# Patient Record
Sex: Male | Born: 2000 | Race: White | Hispanic: No | Marital: Single | Smoking: Never smoker
Health system: Southern US, Community
[De-identification: ages and names within clinical notes are randomized; demographics above are authoritative.]

---
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
## 2018-07-04 ENCOUNTER — Other Ambulatory Visit: Payer: Self-pay | Admitting: Family Medicine

## 2018-12-09 DIAGNOSIS — L600 Ingrowing nail: Secondary | ICD-10-CM | POA: Diagnosis not present

## 2018-12-09 DIAGNOSIS — M25775 Osteophyte, left foot: Secondary | ICD-10-CM | POA: Diagnosis not present

## 2019-01-17 IMAGING — DX DG SHOULDER 2+V*R*
3 series · 3 of 3 positions shown · non-contrast
Comparison: None.

CLINICAL DATA: Pain for 2 weeks

EXAM:
RIGHT SHOULDER - 2+ VIEW

[shoulder grashey]
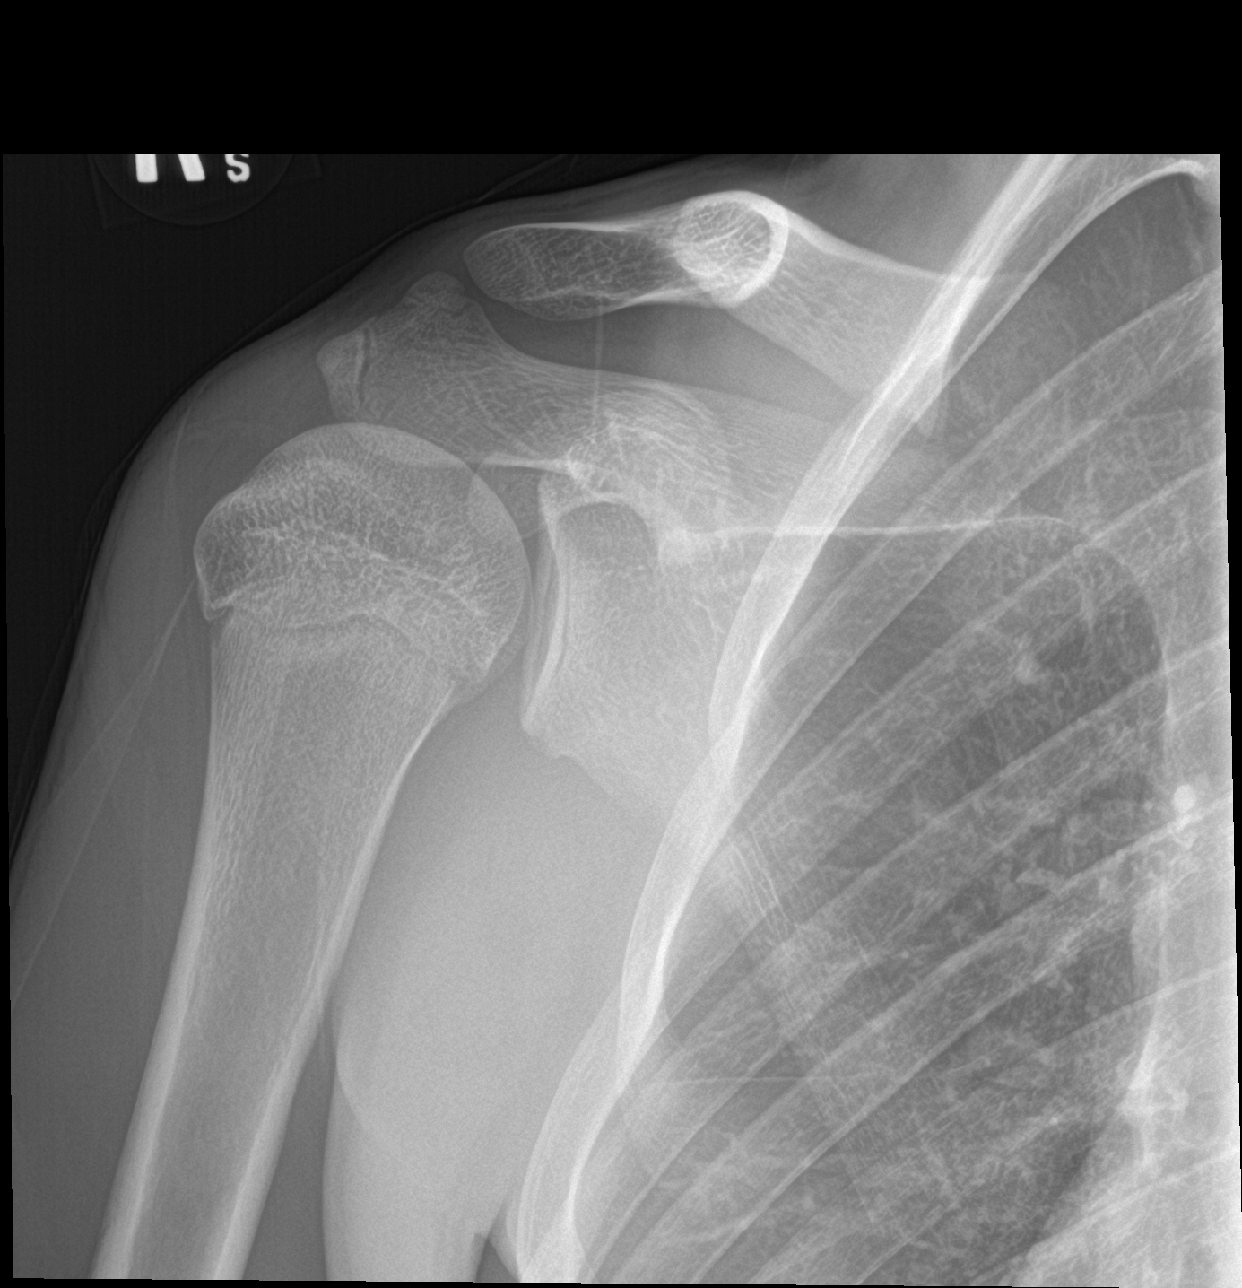

[shoulder y view]
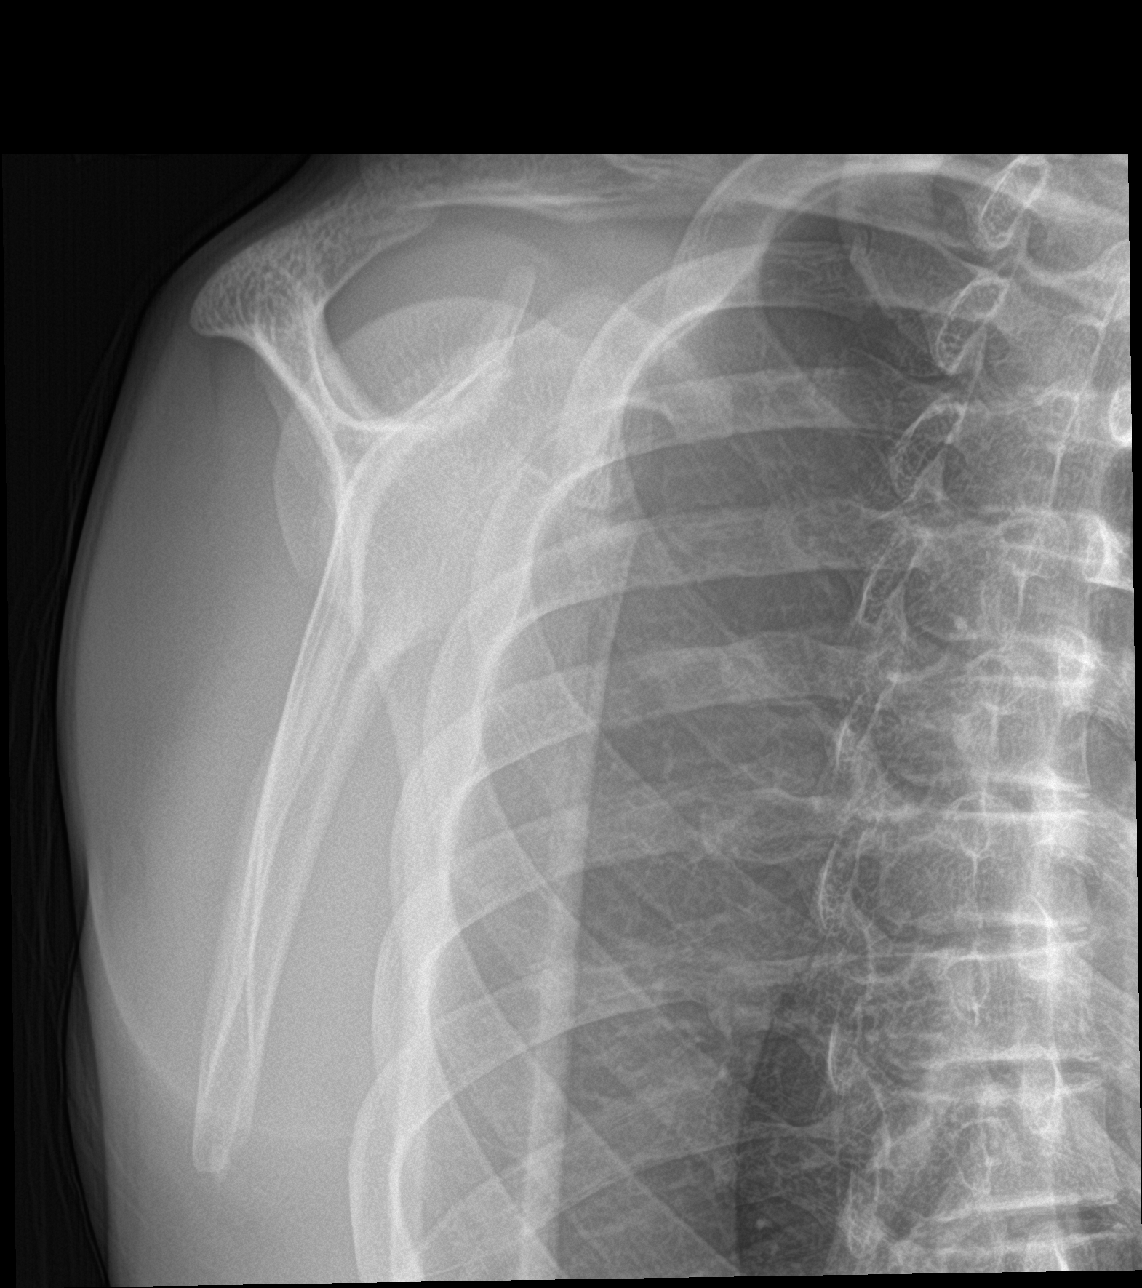

[shoulder axillary]
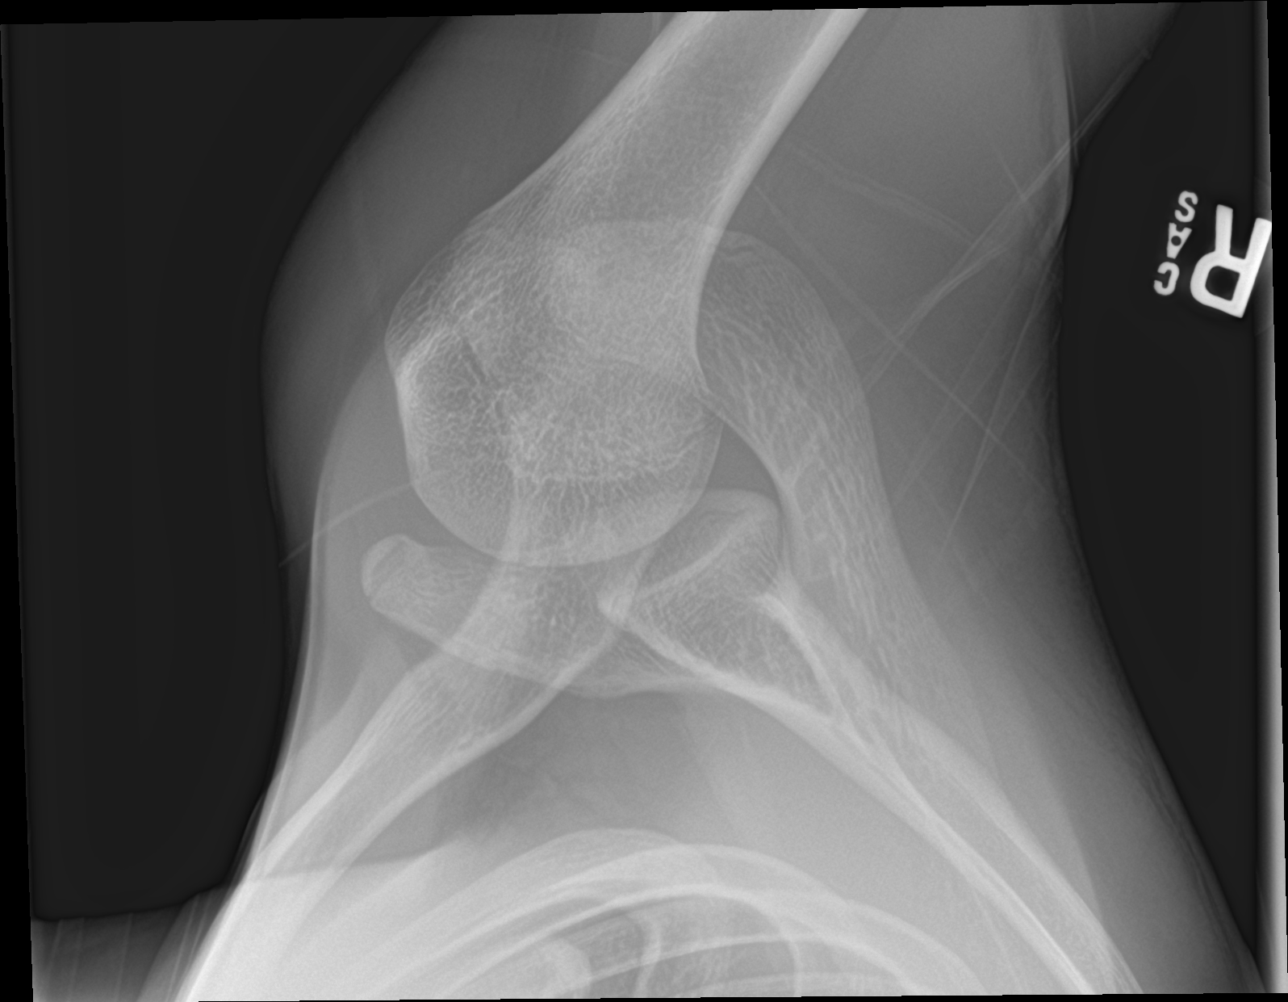

[3 of 3 positions shown; findings below may reference images not displayed]

FINDINGS: Oblique, Y scapular, and axillary images were obtained. No fracture
or dislocation. Joint spaces appear normal. No erosive change.
Visualized right lung is clear.
IMPRESSION: No fracture or dislocation.  No evident arthropathic change.

## 2019-02-02 DIAGNOSIS — L700 Acne vulgaris: Secondary | ICD-10-CM | POA: Diagnosis not present

## 2019-07-17 ENCOUNTER — Other Ambulatory Visit: Payer: Self-pay

## 2019-07-17 ENCOUNTER — Ambulatory Visit (INDEPENDENT_AMBULATORY_CARE_PROVIDER_SITE_OTHER): Payer: 59 | Admitting: Mental Health

## 2019-07-17 DIAGNOSIS — F4323 Adjustment disorder with mixed anxiety and depressed mood: Secondary | ICD-10-CM
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
## 2019-07-31 ENCOUNTER — Ambulatory Visit: Payer: 59 | Admitting: Mental Health

## 2019-08-21 ENCOUNTER — Ambulatory Visit (INDEPENDENT_AMBULATORY_CARE_PROVIDER_SITE_OTHER): Payer: 59 | Admitting: Mental Health

## 2019-08-21 ENCOUNTER — Other Ambulatory Visit: Payer: Self-pay

## 2019-08-21 DIAGNOSIS — F4323 Adjustment disorder with mixed anxiety and depressed mood: Secondary | ICD-10-CM | POA: Diagnosis not present
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
## 2019-09-19 ENCOUNTER — Ambulatory Visit (INDEPENDENT_AMBULATORY_CARE_PROVIDER_SITE_OTHER): Payer: 59 | Admitting: Mental Health

## 2019-09-19 ENCOUNTER — Ambulatory Visit: Payer: 59 | Admitting: Mental Health

## 2019-09-19 ENCOUNTER — Other Ambulatory Visit: Payer: Self-pay

## 2019-09-19 DIAGNOSIS — F4323 Adjustment disorder with mixed anxiety and depressed mood: Secondary | ICD-10-CM

## 2019-09-19 NOTE — Progress Notes (Signed)
[facility] Psychotherapy Note  Name: Dmani Eckerd Date: 09/19/2019 MRN: [id number] DOB: Aug 23, 2001 PCP: Kathyrn Drown, MD  Time spent:  54 minutes  Treatment: Individual therapy  Mental Status Exam:   Appearance:   Casual     Behavior:  Appropriate  Motor:  Normal  Speech/Language:   Clear and Coherent  Affect:  Constricted  Mood:  depressed, anxious  Thought process:  normal  Thought content:    WNL  Sensory/Perceptual disturbances:    WNL  Orientation:  oriented to person, place, time/date and situation  Attention:  Good  Concentration:  Good  Memory:  WNL  Fund of knowledge:   Good  Insight:    Good  Judgment:   Good  Impulse Control:  Good   Reported Symptoms:   Hopelessness at times, anxious, recent feeling of panic, depressed mood, irritable,  Decreased distress tolerance  Risk Assessment: Danger to Self:  passive SI- no plan/intent Self-injurious Behavior: No Danger to Others: No Duty to Warn:no Physical Aggression / Violence:No  Access to Firearms a concern: No  Gang Involvement:No  Patient / guardian was educated about steps to take if suicide or homicide risk level increases between visits: yes While future psychiatric events cannot be accurately predicted, the patient does not currently require acute inpatient psychiatric care and does not currently meet [facility] involuntary commitment criteria.  Subjective:  Pt stated he followed through w/ getting to bed earlier and this has helped decrease some of his anger/irritability. His mother got job recently, seeing her more often. He spoke w/ his father about job possibilities as this was of discussion they previously had.  He stated his father has mentioned him before about working at his employer to gain experience.  Patient was reluctant previously but has decided to give this option a chance, to further investigate.  At this point, if he decides to go forward with employment it would be about 1  year from now.  Patient stated this will give him further time to plan and prepare.  He plans to continue looking into options such as trade school.  Through guided discovery, patient identified some feelings of more contentment as he is made some recent decisions, this combined with improve sleep has left him feeling more hopeful, optimistic.  Patient was encouraged to remind himself of the time he has to make life decisions carefully while keeping self imposed pressure lowered.  Intervention: CBT, supportive therapy, solution focused therapy  Allergies  Allergen Reactions  . Augmentin [Amoxicillin-Pot Clavulanate]     vomiting    Diagnoses:    ICD-10-CM   1. Adjustment disorder with mixed anxiety and depressed mood  F43.23      Plan: Patient is to use CBT, mindfulness and coping skills to help manage decrease symptoms associated with their diagnosis.  Patient to review different college/trade school.  Patient to spend more time with friends versus isolating himself.     Long-term goal:  Reduce overall level, frequency, and intensity of the feelings of depression, anxiety and panic evidenced by decreased irritability, negative         self talk, and helpless feelings from 6 to 7 days/week to 0 to 1 days/week per client report for at least 3 consecutive months. Short-term goal: Verbally express understanding of the relationship between feelings of depression, anxiety and their impact on thinking patterns and         behaviors.  Verbalize an understanding of the role that distorted thinking plays in creating fears, excessive worry, and ruminations.  Assessment of progress:  progressing    Anson [place], [facility]

## 2019-10-24 ENCOUNTER — Ambulatory Visit: Payer: 59 | Admitting: Mental Health

## 2019-11-27 ENCOUNTER — Ambulatory Visit (INDEPENDENT_AMBULATORY_CARE_PROVIDER_SITE_OTHER): Payer: 59 | Admitting: Mental Health

## 2019-11-27 ENCOUNTER — Other Ambulatory Visit: Payer: Self-pay

## 2019-11-27 DIAGNOSIS — F4323 Adjustment disorder with mixed anxiety and depressed mood: Secondary | ICD-10-CM

## 2019-11-27 NOTE — Progress Notes (Signed)
[facility] Psychotherapy Note  Name: Earl Nguyen Date: 11/27/2019 MRN: [id number] DOB: 08-Aug-2001 PCP: Kathyrn Drown, MD  Time spent:  50 minutes  Treatment: Individual therapy  Mental Status Exam:   Appearance:   Casual     Behavior:  Appropriate  Motor:  Normal  Speech/Language:   Clear and Coherent  Affect:  Constricted  Mood:  depressed, anxious  Thought process:  normal  Thought content:    WNL  Sensory/Perceptual disturbances:    WNL  Orientation:  oriented to person, place, time/date and situation  Attention:  Good  Concentration:  Good  Memory:  WNL  Fund of knowledge:   Good  Insight:    Good  Judgment:   Good  Impulse Control:  Good   Reported Symptoms:   Hopelessness at times, anxious, recent feeling of panic, depressed mood, irritable,  Decreased distress tolerance  Risk Assessment: Danger to Self:  passive SI- no plan/intent Self-injurious Behavior: No Danger to Others: No Duty to Warn:no Physical Aggression / Violence:No  Access to Firearms a concern: No  Gang Involvement:No  Patient / guardian was educated about steps to take if suicide or homicide risk level increases between visits: yes While future psychiatric events cannot be accurately predicted, the patient does not currently require acute inpatient psychiatric care and does not currently meet [facility] involuntary commitment criteria.  Subjective:  Pt stated he has been sleeping better, improved schedule at night. Went to [place] with some friends, made some work friends.  Shared the differences between being in high school, dating; stated he wonders if he will be "alone" for the rest of his life.  Patient was encouraged to give himself time as he continues to adjust to adult life.  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  Through discovery, he  identified sometimes feeling like he is not making progress in some areas of his life.  We discussed having some potential long-term goals some of which will be targeted accurately while others he will discontinue based on life experiences over the next few months and possibly years.  We discussed the importance of having goals along the way.  He shared how he feels he is making progress in adjusting to these life changes, less anxiety.  Patient was encouraged to identify potential long-term goals as well as short-term between sessions.  Intervention: CBT, supportive therapy, solution focused therapy  Allergies  Allergen Reactions  . Augmentin [Amoxicillin-Pot Clavulanate]     vomiting    Diagnoses:    ICD-10-CM   1. Adjustment disorder with mixed anxiety and depressed mood  F43.23      Plan: Patient is to use CBT, mindfulness and coping skills to help manage decrease symptoms associated with their diagnosis.  Patient to identify some long-term and short-term goals between sessions.   Long-term goal:  Reduce overall level, frequency, and intensity of the feelings of depression, anxiety and panic evidenced by decreased irritability, negative         self talk, and helpless feelings from 6 to 7 days/week to 0 to 1 days/week per client report for at least 3 consecutive months. Short-term goal: Verbally express understanding of the relationship between feelings of depression, anxiety and their impact on thinking patterns and         behaviors.  Verbalize an understanding of the role that distorted thinking plays in creating fears, excessive worry, and ruminations.  Assessment of progress:  progressing    [place], [facility]

## 2020-01-03 ENCOUNTER — Ambulatory Visit: Payer: 59 | Admitting: Mental Health

## 2021-08-12 ENCOUNTER — Ambulatory Visit: Payer: 59 | Admitting: Podiatry

## 2021-08-12 ENCOUNTER — Other Ambulatory Visit: Payer: Self-pay

## 2021-08-12 ENCOUNTER — Encounter: Payer: Self-pay | Admitting: Podiatry

## 2021-08-12 DIAGNOSIS — L600 Ingrowing nail: Secondary | ICD-10-CM

## 2021-08-12 MED ORDER — NEOMYCIN-POLYMYXIN-HC 1 % OT SOLN: OTIC | 1 refills | Status: AC

## 2021-08-12 NOTE — Patient Instructions (Signed)

## 2021-08-13 NOTE — Progress Notes (Signed)
  Subjective:  Patient ID: Earl Nguyen, male    DOB: November 12, 2000,  MRN: [id number] HPI Chief Complaint  Patient presents with   Toe Pain    Hallux bilateral - lateral borders, red, swollen, tender x couple weeks, both sides have been cut out by Dr. Gershon Mussel twice already.   New Patient (Initial Visit)    20 y.o. male presents with the above complaint.   ROS: Denies fever chills nausea vomiting muscle aches pains calf pain back pain chest pain shortness of breath.  No past medical history on file. No past surgical history on file.  Current Outpatient Medications:    NEOMYCIN-POLYMYXIN-HYDROCORTISONE (CORTISPORIN) 1 % SOLN OTIC solution, Apply 1-2 drops to toe BID after soaking, Disp: 10 mL, Rfl: 1   CLARAVIS 40 MG capsule, Take 160 mg by mouth daily., Disp: , Rfl:   Allergies  Allergen Reactions   Augmentin [Amoxicillin-Pot Clavulanate]     vomiting   Review of Systems Objective:  There were no vitals filed for this visit.  General: Well developed, nourished, in no acute distress, alert and oriented x3   Dermatological: Skin is warm, dry and supple bilateral. Nails x 10 are well maintained; remaining integument appears unremarkable at this time. There are no open sores, no preulcerative lesions, no rash or signs of infection present.  Red sharply incurvated fibular margin of the hallux bilateral there appears to be some irritation from picking there is no purulence no cellulitis or drainage.  Vascular: Dorsalis Pedis artery and Posterior Tibial artery pedal pulses are 2/4 bilateral with immedate capillary fill time. Pedal hair growth present. No varicosities and no lower extremity edema present bilateral.   Neruologic: Grossly intact via light touch bilateral. Vibratory intact via tuning fork bilateral. Protective threshold with Semmes Wienstein monofilament intact to all pedal sites bilateral. Patellar and Achilles deep tendon reflexes 2+ bilateral. No Babinski or clonus noted  bilateral.   Musculoskeletal: No gross boney pedal deformities bilateral. No pain, crepitus, or limitation noted with foot and ankle range of motion bilateral. Muscular strength 5/5 in all groups tested bilateral.  Gait: Unassisted, Nonantalgic.    Radiographs:  None taken  Assessment & Plan:   Assessment: Ingrown nail fibular border hallux bilateral  Plan: Discussed etiology pathology conservative surgical therapies at this point performed a chemical matricectomy along the fibular border the hallux bilateral.  He tolerated procedure well without complications.  He was given both oral and written home-going instruction for the care and soaking of the toe bilateral.  Follow-up with him in 2 weeks.  He also received a prescription for Cortisporin Otic to be applied twice daily after soaking.     Mateja Dier T. [place], [place]

## 2021-08-26 ENCOUNTER — Ambulatory Visit: Payer: Self-pay | Admitting: Podiatry

## 2021-08-27 ENCOUNTER — Other Ambulatory Visit: Payer: Self-pay

## 2021-08-27 ENCOUNTER — Encounter: Payer: Self-pay | Admitting: Podiatry

## 2021-08-27 ENCOUNTER — Ambulatory Visit: Payer: Self-pay | Admitting: Podiatry

## 2021-08-27 ENCOUNTER — Ambulatory Visit (INDEPENDENT_AMBULATORY_CARE_PROVIDER_SITE_OTHER): Payer: 59 | Admitting: Podiatry

## 2021-08-27 DIAGNOSIS — L600 Ingrowing nail: Secondary | ICD-10-CM

## 2021-08-27 MED ORDER — DOXYCYCLINE HYCLATE 100 MG PO TABS: 100.0000 mg | ORAL_TABLET | Freq: Two times a day (BID) | ORAL | 0 refills | Status: AC

## 2021-08-27 NOTE — Progress Notes (Signed)
   Subjective: 20 y.o. male presents today status post permanent nail avulsion procedure of the lateral border of the bilateral great toes that was performed on 08/12/2021.  Patient has been applying the neomycin drops and soaking his foot as instructed.  He says the right foot is more swollen and draining however the left toes is doing very well.  He presents for further treatment evaluation  History reviewed. No pertinent past medical history.  Objective: Left great toe appears to be healing routinely.  Mild erythema around the periungual region likely due to phenol chemical matricectomy.Nail and respective nail fold appears to be healing appropriately.   Right great toe continues to have some drainage with a fibrotic base along the nail avulsion site.  There is some localized erythema around the base of the nail plate as well.  Assessment: #1 s/p partial permanent nail matrixectomy bilateral great toes lateral borders.   Plan of care: #1 patient was evaluated  #2 light debridement of open wound was performed to the periungual border of the respective toe using a currette.  #3 continue the Corticosporin drops and soaking daily to the right foot and toe #4 prescription for doxycycline 100 mg 2 times daily #20 #5 return to clinic in 2 weeks to ensure the infection is resolved   Edrick Kins, DPM [facility]  Dr. Edrick Kins, DPM    [address] [place],  [address]                Office [phone]  Fax [phone]

## 2021-09-11 ENCOUNTER — Ambulatory Visit: Payer: Self-pay | Admitting: Podiatry
